# Patient Record
Sex: Male | Born: 2000 | Race: White | Hispanic: No
Health system: Western US, Academic
[De-identification: ages and names within clinical notes are randomized; demographics above are authoritative.]

## PROBLEM LIST (undated history)

## (undated) DEATH — deceased

---
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
## 2022-04-26 IMAGING — DX XR finger LT
3 series · 3 of 3 positions shown · non-contrast
Comparison: None.

Procedure(s): XR finger LT
PROCEDURE:
XR FINGER 3 VIEW ,
HISTORY: Nail and thumb
TECHNIQUE: 3 views of the left third digit

[finger lat]
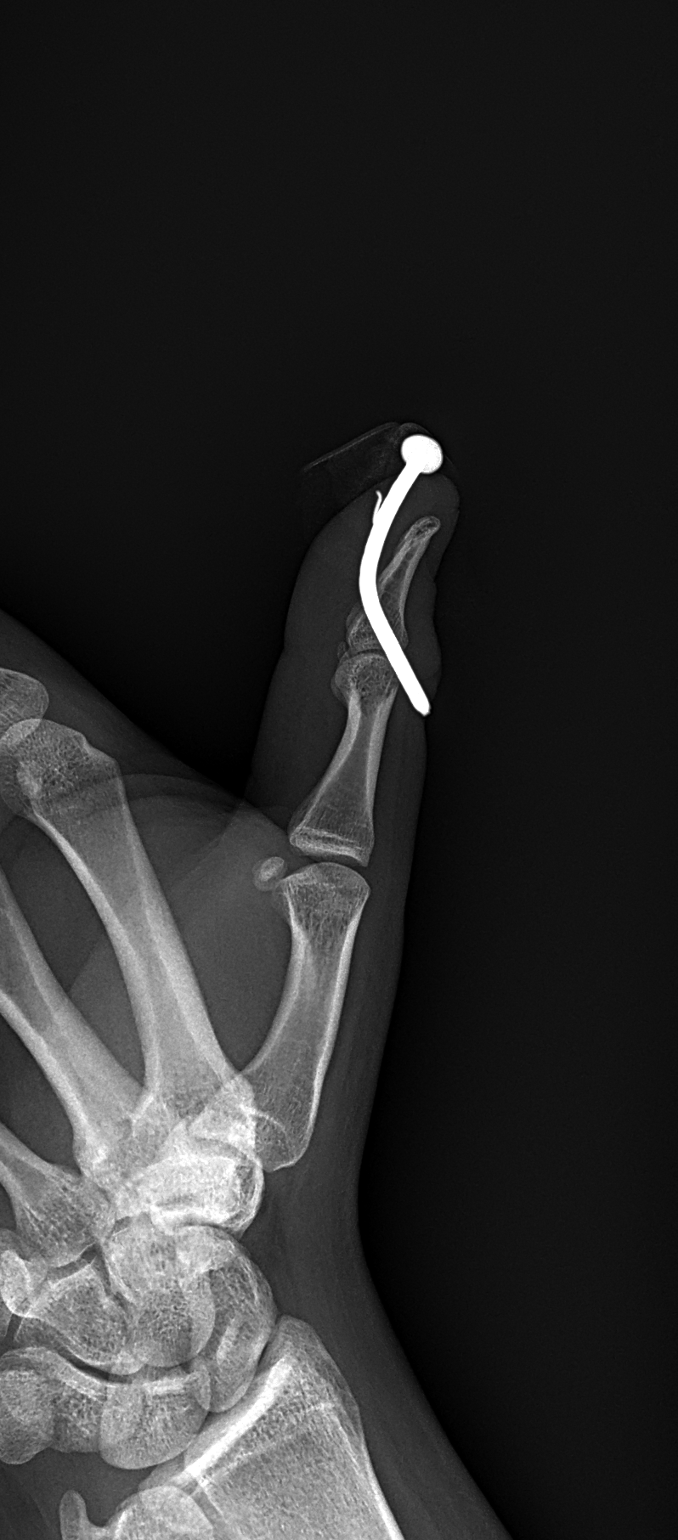

[finger pa (1 of 2)]
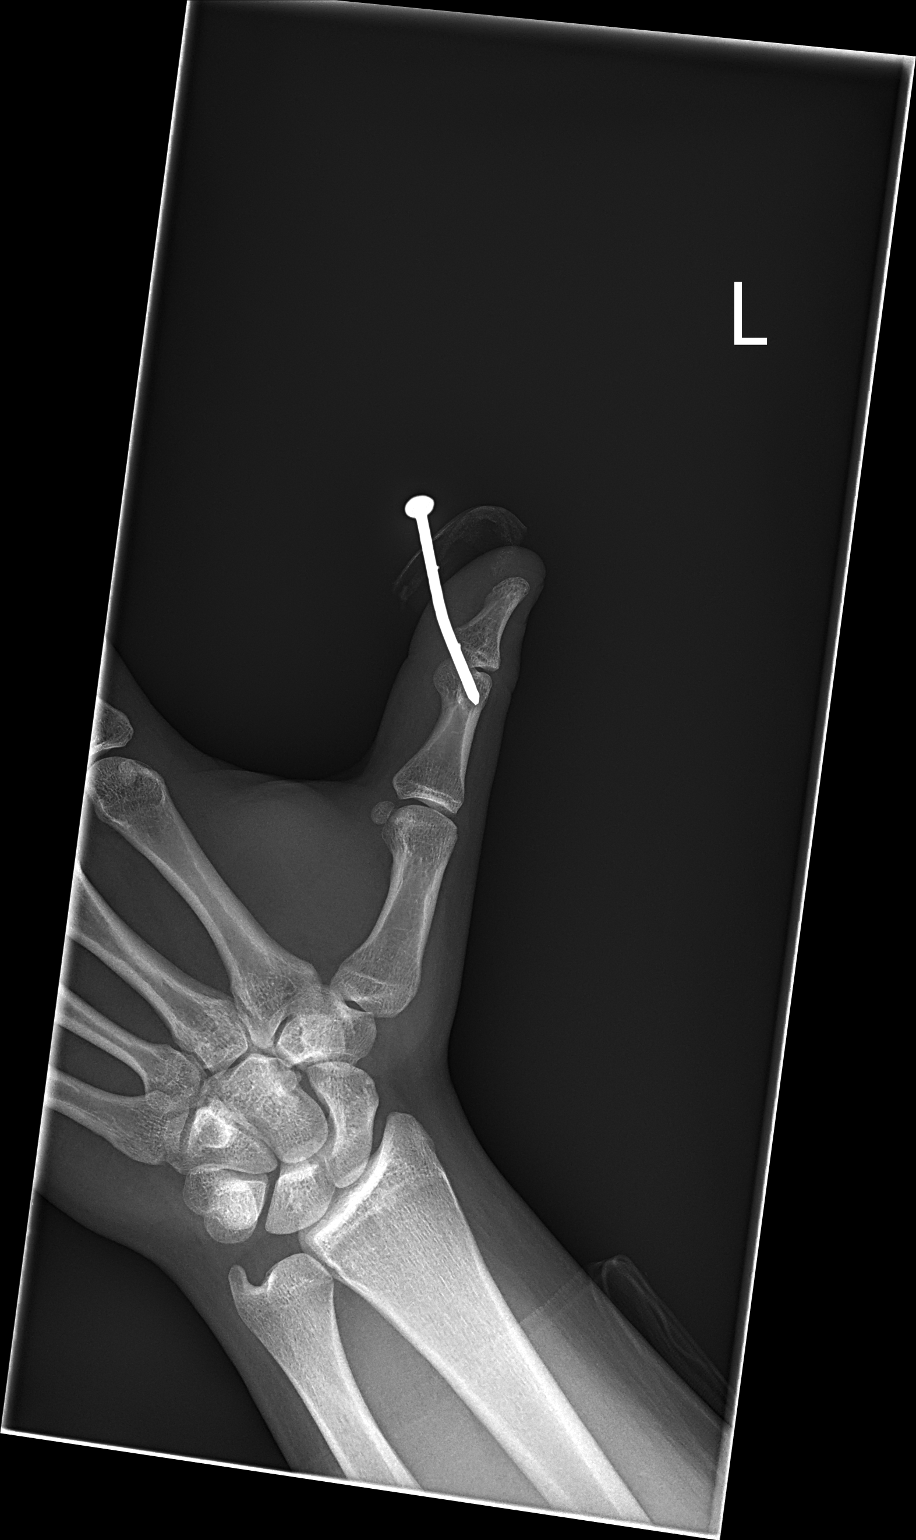

[finger pa (2 of 2)]
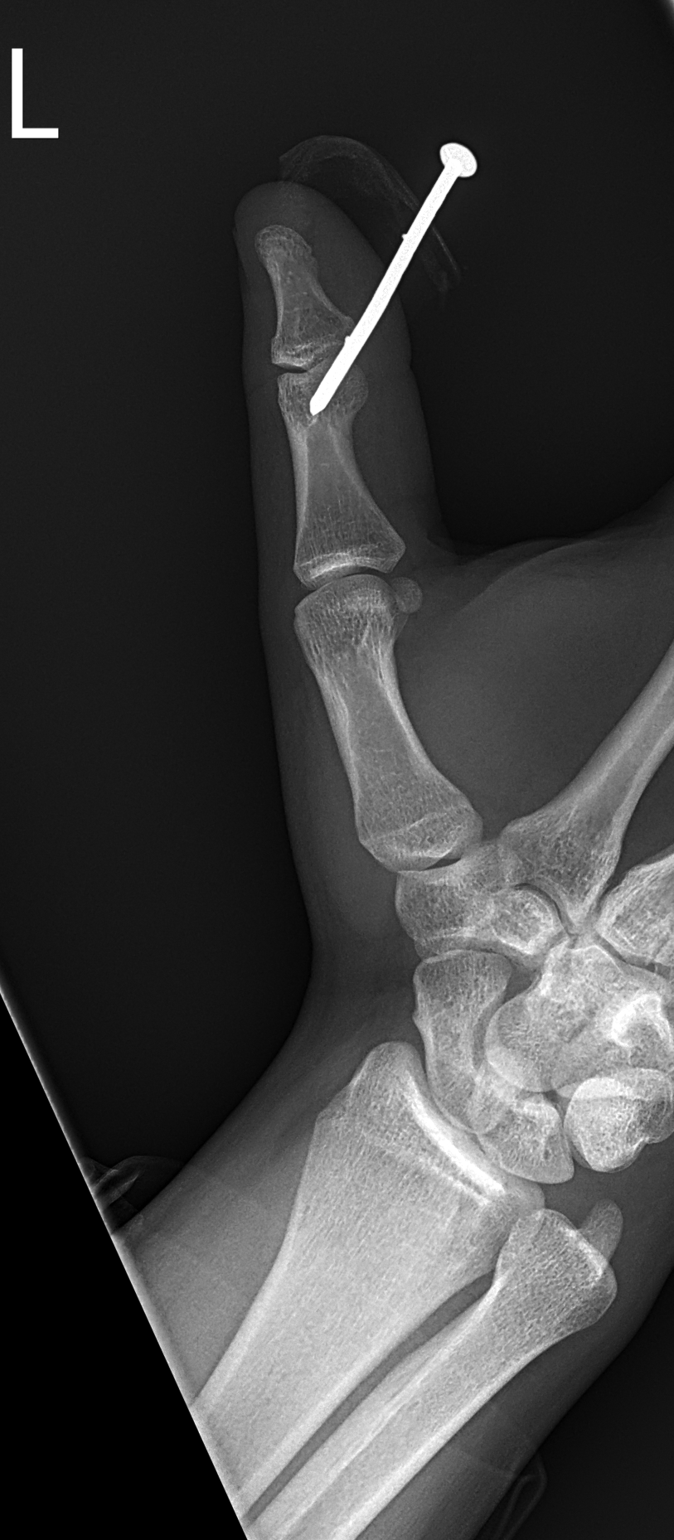

[3 of 3 positions shown; findings below may reference images not displayed]

FINDINGS/IMPRESSION:
There is a radiodense nail embedded within the first proximal phalanx.  No associated displaced
fracture.  No dislocations.  No significant soft tissue swelling.
